# Patient Record
Sex: Male | Born: 1995
Health system: Southern US, Community
[De-identification: ages and names within clinical notes are randomized; demographics above are authoritative.]

---
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
## 2016-03-27 ENCOUNTER — Encounter (HOSPITAL_COMMUNITY): Payer: Self-pay | Admitting: Emergency Medicine

## 2016-03-27 ENCOUNTER — Ambulatory Visit (HOSPITAL_COMMUNITY)
Admission: EM | Admit: 2016-03-27 | Discharge: 2016-03-27 | Disposition: A | Discharge status: Home / Self Care | Payer: 59 | Attending: Family Medicine | Admitting: Family Medicine

## 2016-03-27 DIAGNOSIS — G43A0 Cyclical vomiting, not intractable: Secondary | ICD-10-CM | POA: Diagnosis not present

## 2016-03-27 DIAGNOSIS — R6889 Other general symptoms and signs: Secondary | ICD-10-CM | POA: Diagnosis not present

## 2016-03-27 DIAGNOSIS — R5081 Fever presenting with conditions classified elsewhere: Secondary | ICD-10-CM

## 2016-03-27 MED ORDER — ONDANSETRON 4 MG PO TBDP
4.0000 mg | ORAL_TABLET | Freq: Once | ORAL | Status: AC
  Administered 2016-03-27: 4 mg via ORAL

## 2016-03-27 MED ORDER — ACETAMINOPHEN 325 MG PO TABS
650.0000 mg | ORAL_TABLET | Freq: Once | ORAL | Status: AC
  Administered 2016-03-27: 650 mg via ORAL

## 2016-03-27 MED ORDER — ONDANSETRON 4 MG PO TBDP
ORAL_TABLET | ORAL | Status: AC
  Filled 2016-03-27: qty 1

## 2016-03-27 MED ORDER — ONDANSETRON 8 MG PO TBDP: 8.0000 mg | ORAL_TABLET | Freq: Three times a day (TID) | ORAL | 0 refills | Status: DC | PRN

## 2016-03-27 MED ORDER — ACETAMINOPHEN 325 MG PO TABS
ORAL_TABLET | ORAL | Status: AC
  Filled 2016-03-27: qty 2

## 2016-03-27 MED ORDER — OSELTAMIVIR PHOSPHATE 75 MG PO CAPS: 75.0000 mg | ORAL_CAPSULE | Freq: Two times a day (BID) | ORAL | 0 refills | Status: DC

## 2016-03-27 NOTE — ED Triage Notes (Signed)
[facility] with a complaint of abdominal cramping with N/V and general body aches that started today.

## 2016-03-27 NOTE — ED Provider Notes (Signed)
CSN: [id number]     Arrival date & time 03/27/16  1836 History   First MD Initiated Contact with Patient 03/27/16 1950     Chief Complaint  Patient presents with  . Abdominal Cramping   (Consider location/radiation/quality/duration/timing/severity/associated sxs/prior Treatment) Patient c/o NVD fever and vomiting.   The history is provided by the patient.  Abdominal Cramping  This is a new problem. The current episode started more than 2 days ago. The problem occurs constantly. The problem has not changed since onset.Nothing aggravates the symptoms. Nothing relieves the symptoms. He has tried nothing for the symptoms.    History reviewed. No pertinent past medical history. History reviewed. No pertinent surgical history. History reviewed. No pertinent family history. Social History  Substance Use Topics  . Smoking status: Current Some Day Smoker    Packs/day: 0.50    Types: Cigarettes  . Smokeless tobacco: Never Used  . Alcohol use No    Review of Systems  Constitutional: Negative.   HENT: Negative.   Eyes: Negative.   Respiratory: Negative.   Cardiovascular: Negative.   Gastrointestinal: Positive for diarrhea, nausea and vomiting.  Endocrine: Negative.   Genitourinary: Negative.   Musculoskeletal: Negative.   Skin: Negative.   Allergic/Immunologic: Negative.   Neurological: Negative.   Hematological: Negative.   Psychiatric/Behavioral: Negative.     Allergies  Patient has no known allergies.  Home Medications   Prior to Admission medications   Medication Sig Start Date End Date Taking? Authorizing Provider  ondansetron (ZOFRAN ODT) 8 MG disintegrating tablet Take 1 tablet (8 mg total) by mouth every 8 (eight) hours as needed for nausea or vomiting. 03/27/16   Deatra Canter, FNP  oseltamivir (TAMIFLU) 75 MG capsule Take 1 capsule (75 mg total) by mouth every 12 (twelve) hours. 03/27/16   Deatra Canter, FNP   Meds Ordered and Administered this Visit    Medications  ondansetron (ZOFRAN-ODT) disintegrating tablet 4 mg (not administered)  acetaminophen (TYLENOL) tablet 650 mg (650 mg Oral Given 03/27/16 1923)    BP 110/84 (BP Location: Left Arm)   Pulse 108   Temp 102.3 F (39.1 C) (Oral)   Resp 16   SpO2 100%  No data found.   Physical Exam  Constitutional: He appears well-developed and well-nourished.  HENT:  Head: Normocephalic and atraumatic.  Right Ear: External ear normal.  Left Ear: External ear normal.  Mouth/Throat: Oropharynx is clear and moist.  Eyes: EOM are normal. Pupils are equal, round, and reactive to light.  Neck: Normal range of motion. Neck supple.  Cardiovascular: Normal rate, regular rhythm and normal heart sounds.   Pulmonary/Chest: Effort normal and breath sounds normal.  Abdominal: Soft. Bowel sounds are normal.  Musculoskeletal: Normal range of motion.  Nursing note and vitals reviewed.   Urgent Care Course   Clinical Course     Procedures (including critical care time)  Labs Review Labs Reviewed - No data to display  Imaging Review No results found.   Visual Acuity Review  Right Eye Distance:   Left Eye Distance:   Bilateral Distance:    Right Eye Near:   Left Eye Near:    Bilateral Near:         MDM   1. Cyclical vomiting with nausea, intractability of vomiting not specified   2. Flu-like symptoms   3. Fever in other diseases    Zofran ODT 4mg  po qd  Zofran ODT 8mg  po tid prn #20 Tamiflu 75mg  one po bid x 5 days #  10  Push po fluids, rest, tylenol and motrin otc prn as directed for fever, arthralgias, and myalgias.  Follow up prn if sx's continue or persist.    Deatra CanterWilliam J Alfred Eckley, FNP 03/27/16 2005

## 2016-03-28 MED FILL — OSELTAMIVIR PHOS 75 MG CAP: 75 | 5 days supply | Qty: 10 | Fill #0

## 2016-03-28 MED FILL — ONDANSETRON ODT 8 MG TABLET: 8 | 7 days supply | Qty: 20 | Fill #0

## 2016-11-20 ENCOUNTER — Ambulatory Visit: Payer: 59 | Admitting: Family Medicine

## 2018-05-05 DIAGNOSIS — M549 Dorsalgia, unspecified: Secondary | ICD-10-CM | POA: Diagnosis not present

## 2018-05-05 DIAGNOSIS — Z7689 Persons encountering health services in other specified circumstances: Secondary | ICD-10-CM | POA: Diagnosis not present

## 2018-05-05 DIAGNOSIS — B360 Pityriasis versicolor: Secondary | ICD-10-CM | POA: Diagnosis not present

## 2018-05-05 DIAGNOSIS — Z2821 Immunization not carried out because of patient refusal: Secondary | ICD-10-CM | POA: Diagnosis not present

## 2018-05-21 MED FILL — ITRACONAZOLE 100 MG CAPSULE: 100 | 5 days supply | Qty: 10 | Fill #0

## 2018-05-21 MED FILL — KETOCONAZOLE 2% CREAM: 2 | 10 days supply | Qty: 60 | Fill #0

## 2020-10-20 ENCOUNTER — Other Ambulatory Visit (HOSPITAL_COMMUNITY): Payer: Self-pay

## 2020-10-20 MED ORDER — CARESTART COVID-19 HOME TEST VI KIT
PACK | 0 refills | Status: DC
  Filled 2020-10-20: qty 4, 4d supply, fill #0

## 2020-10-23 ENCOUNTER — Other Ambulatory Visit (HOSPITAL_COMMUNITY): Payer: Self-pay

## 2020-12-06 DIAGNOSIS — M25512 Pain in left shoulder: Secondary | ICD-10-CM | POA: Diagnosis not present

## 2020-12-06 DIAGNOSIS — S43402A Unspecified sprain of left shoulder joint, initial encounter: Secondary | ICD-10-CM | POA: Diagnosis not present

## 2020-12-06 DIAGNOSIS — F17210 Nicotine dependence, cigarettes, uncomplicated: Secondary | ICD-10-CM | POA: Diagnosis not present

## 2020-12-06 DIAGNOSIS — S46812A Strain of other muscles, fascia and tendons at shoulder and upper arm level, left arm, initial encounter: Secondary | ICD-10-CM | POA: Diagnosis not present

## 2021-07-11 ENCOUNTER — Encounter (HOSPITAL_COMMUNITY): Payer: Self-pay | Admitting: Emergency Medicine

## 2021-07-11 ENCOUNTER — Ambulatory Visit (HOSPITAL_COMMUNITY)
Admission: EM | Admit: 2021-07-11 | Discharge: 2021-07-11 | Disposition: A | Discharge status: Home / Self Care | Payer: 59 | Attending: Emergency Medicine | Admitting: Emergency Medicine

## 2021-07-11 DIAGNOSIS — J029 Acute pharyngitis, unspecified: Secondary | ICD-10-CM | POA: Diagnosis not present

## 2021-07-11 DIAGNOSIS — R519 Headache, unspecified: Secondary | ICD-10-CM | POA: Insufficient documentation

## 2021-07-11 DIAGNOSIS — R0789 Other chest pain: Secondary | ICD-10-CM | POA: Diagnosis not present

## 2021-07-11 DIAGNOSIS — Z20822 Contact with and (suspected) exposure to covid-19: Secondary | ICD-10-CM | POA: Diagnosis not present

## 2021-07-11 DIAGNOSIS — R030 Elevated blood-pressure reading, without diagnosis of hypertension: Secondary | ICD-10-CM | POA: Insufficient documentation

## 2021-07-11 LAB — POC INFLUENZA A AND B ANTIGEN (URGENT CARE ONLY)
INFLUENZA A ANTIGEN, POC: NEGATIVE
INFLUENZA B ANTIGEN, POC: NEGATIVE

## 2021-07-11 LAB — POCT RAPID STREP A, ED / UC: Streptococcus, Group A Screen (Direct): NEGATIVE

## 2021-07-11 MED ORDER — IBUPROFEN 800 MG PO TABS: 800.0000 mg | ORAL_TABLET | Freq: Three times a day (TID) | ORAL | 0 refills | Status: DC

## 2021-07-11 NOTE — Discharge Instructions (Addendum)
You will be called with the result of the covid rapid in 24 hours- trial of motrin for headache pain and chest wall pain- read the included information. Rapid strep was neagative  and flu was negative . 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 Start regularly checking your blood pressures at home. If elevated more than 50 % of the time, it may be time to start BP lowering medications ?

## 2021-07-11 NOTE — ED Triage Notes (Signed)
Pt repots headache that is intermittent in bilat temporal areas and center chest pains as well. 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  ?

## 2021-07-11 NOTE — ED Provider Notes (Signed)
?[place] ? ? ?MRN: [id number] DOB: 1996/02/11 ? ?Subjective:  ? ?Chief Complaint;  ?Chief Complaint  ?Patient presents with  ? Headache  ? Chest Pain  ? ?Pt reports headache that is intermittent in bilat temporal areas and posterior 3/10- constant waxes and wanes highest "5" and center chest pains as well. 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 ?KASHTON MCARTOR is a 26 y.o. male presenting for 2-day history of headaches bilateral temporal and posterior head.  Patient states he was at the [facility] park a couple of days ago and shortly thereafter started not feeling well positive mild nausea.  He denies fever, visual changes, shortness of breath, vomiting chest pain or cough-  ? ?No current facility-administered medications for this encounter. ? ?Current Outpatient Medications:  ?  ibuprofen (ADVIL) 800 MG tablet, Take 1 tablet (800 mg total) by mouth 3 (three) times daily., Disp: 21 tablet, Rfl: 0 ?  COVID-19 At Home Antigen Test [facility] COVID-19 HOME TEST) KIT, Use as directed, Disp: 4 each, Rfl: 0 ?  ondansetron (ZOFRAN ODT) 8 MG disintegrating tablet, Take 1 tablet (8 mg total) by mouth every 8 (eight) hours as needed for nausea or vomiting., Disp: 20 tablet, Rfl: 0 ?  oseltamivir (TAMIFLU) 75 MG capsule, Take 1 capsule (75 mg total) by mouth every 12 (twelve) hours., Disp: 10 capsule, Rfl: 0  ? ?No Known Allergies ? ?History reviewed. No pertinent past medical history.  ? ?Review of Systems  ?All other systems reviewed and are negative. ? ? ?Objective:  ? ?Vitals: ?BP (!) 155/96 (BP Location: Right Arm)   Pulse 73   Temp 98.3 ?F (36.8 ?C) (Oral)   Resp 14   SpO2 98%  ? ?Physical Exam ?Vitals and nursing note reviewed.  ?Constitutional:   ?   General: He is not in acute distress. ?   Appearance: He is well-developed.  ?HENT:  ?   Head: Normocephalic and atraumatic.  ?    Comments: Positive posterior pharyngeal erythema without significant injection tonsils absent no exudate no stridor ?Eyes:  ?   Conjunctiva/sclera: Conjunctivae normal.  ?Cardiovascular:  ?   Rate and Rhythm: Normal rate and regular rhythm.  ?   Heart sounds: Normal heart sounds. No murmur heard. ?   Comments: Chest pain is reproduced on compression of upper sternum- no rash ?Pulmonary:  ?   Effort: Pulmonary effort is normal. No respiratory distress.  ?   Breath sounds: Normal breath sounds.  ?Abdominal:  ?   Palpations: Abdomen is soft.  ?   Tenderness: There is no abdominal tenderness.  ?Musculoskeletal:     ?   General: No swelling.  ?   Cervical back: Normal range of motion and neck supple.  ?Skin: ?   General: Skin is warm and dry.  ?   Capillary Refill: Capillary refill takes less than 2 seconds.  ?Neurological:  ?   Mental Status: He is alert.  ?   Cranial Nerves: No cranial nerve deficit.  ?Psychiatric:     ?   Mood and Affect: Mood normal.  ? ? ?Results for orders placed or performed during the hospital encounter of 07/11/21 (from the past 24 hour(s))  ?POCT Rapid Strep A     Status: None  ? Collection Time: 07/11/21  2:42 PM  ?Result Value Ref Range  ? Streptococcus, Group A Screen (  Direct) NEGATIVE NEGATIVE  ?POC Influenza A & B Ag (Urgent Care)     Status: None  ? Collection Time: 07/11/21  2:46 PM  ?Result Value Ref Range  ? INFLUENZA A ANTIGEN, POC NEGATIVE NEGATIVE  ? INFLUENZA B ANTIGEN, POC NEGATIVE NEGATIVE  ? ? ?No results found.  ?  ? ?Assessment and Plan :  ? ?1. Acute intractable headache, unspecified headache type   ?2. Elevated blood pressure reading   ?3. Chest wall pain   ? ? ?Meds ordered this encounter  ?Medications  ? ibuprofen (ADVIL) 800 MG tablet  ?  Sig: Take 1 tablet (800 mg total) by mouth 3 (three) times daily.  ?  Dispense:  21 tablet  ?  Refill:  0  ? ? ?MDM:  ?MARLO GOODRICH is a 26 y.o. male presenting for 2-day history of bitemporal and posterior headaches that wax and  wane throughout the day but did not resolve.  He has not been taking any medications for this and denies any fever or neurologic deficits.  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  COVID is pending.  Flu and strep were negative ?Patient is encouraged to use Motrin for chest wall pain and headaches that is reproducible and will start checking blood pressures at home given his elevated blood pressure today.  As discussed with patient he may need to consider none freezer room work to prevent persistent headaches.  I discussed treatment, follow up and return instructions. Questions were answered. Patient stated understanding of instructions and is stable for discharge. ? ?Leida Lauth FNP-C MCN  ? ?  ?Hezzie Bump, NP ?07/11/21 1448 ? ?

## 2021-07-12 LAB — SARS CORONAVIRUS 2 (TAT 6-24 HRS): SARS Coronavirus 2: NEGATIVE

## 2021-07-17 NOTE — Progress Notes (Deleted)
   New Patient Office Visit  Subjective    Patient ID: MILBERT BIXLER, male    DOB: 04-30-1995  Age: 26 y.o. MRN: [id number]  CC: No chief complaint on file.   HPI Christopher Simmons presents for new patient visit to establish care.  Introduced to Designer, jewellery role and practice setting.  All questions answered.  Discussed provider/patient relationship and expectations.   Outpatient Encounter Medications as of 07/19/2021  Medication Sig   COVID-19 At Home Antigen Test ([facility] COVID-19 HOME TEST) KIT Use as directed   ibuprofen (ADVIL) 800 MG tablet Take 1 tablet (800 mg total) by mouth 3 (three) times daily.   ondansetron (ZOFRAN ODT) 8 MG disintegrating tablet Take 1 tablet (8 mg total) by mouth every 8 (eight) hours as needed for nausea or vomiting.   oseltamivir (TAMIFLU) 75 MG capsule Take 1 capsule (75 mg total) by mouth every 12 (twelve) hours.   No facility-administered encounter medications on file as of 07/19/2021.    No past medical history on file.  No past surgical history on file.  No family history on file.  Social History   Socioeconomic History   Marital status: Single    Spouse name: Not on file   Number of children: Not on file   Years of education: Not on file   Highest education level: Not on file  Occupational History   Not on file  Tobacco Use   Smoking status: Some Days    Packs/day: 0.50    Types: Cigarettes   Smokeless tobacco: Never  Substance and Sexual Activity   Alcohol use: No   Drug use: Yes    Types: Marijuana   Sexual activity: Never  Other Topics Concern   Not on file  Social History Narrative   Not on file   Social Determinants of Health   Financial Resource Strain: Not on file  Food Insecurity: Not on file  Transportation Needs: Not on file  Physical Activity: Not on file  Stress: Not on file  Social Connections: Not on file  Intimate Partner Violence: Not on file    ROS      Objective    There were no  vitals taken for this visit.  Physical Exam  {Labs (Optional):23779}    Assessment & Plan:   Problem List Items Addressed This Visit   None   No follow-ups on file.   Charyl Dancer, NP

## 2021-07-19 ENCOUNTER — Ambulatory Visit: Payer: 59 | Admitting: Nurse Practitioner

## 2021-07-19 ENCOUNTER — Telehealth: Payer: Self-pay | Admitting: Nurse Practitioner

## 2021-07-19 NOTE — Telephone Encounter (Signed)
Pt was a no show for a NP app with Lauren on 07/19/21, I sent a no show letter. ?

## 2021-07-26 NOTE — Telephone Encounter (Signed)
1st no show, fee waived ?

## 2023-03-25 ENCOUNTER — Ambulatory Visit (INDEPENDENT_AMBULATORY_CARE_PROVIDER_SITE_OTHER): Payer: Self-pay | Admitting: Gastroenterology

## 2023-03-25 ENCOUNTER — Other Ambulatory Visit (INDEPENDENT_AMBULATORY_CARE_PROVIDER_SITE_OTHER): Payer: Self-pay

## 2023-03-25 ENCOUNTER — Encounter: Payer: Self-pay | Admitting: Gastroenterology

## 2023-03-25 VITALS — BP 100/60 | Temp 78.0°F | Ht 75.0 in | Wt 168.0 lb

## 2023-03-25 DIAGNOSIS — K6289 Other specified diseases of anus and rectum: Secondary | ICD-10-CM

## 2023-03-25 DIAGNOSIS — K625 Hemorrhage of anus and rectum: Secondary | ICD-10-CM

## 2023-03-25 DIAGNOSIS — R195 Other fecal abnormalities: Secondary | ICD-10-CM

## 2023-03-25 DIAGNOSIS — K648 Other hemorrhoids: Secondary | ICD-10-CM

## 2023-03-25 LAB — CBC
HCT: 43.5 % (ref 39.0–52.0)
Hemoglobin: 14.7 g/dL (ref 13.0–17.0)
MCHC: 33.8 g/dL (ref 30.0–36.0)
MCV: 92.1 fL (ref 78.0–100.0)
Platelets: 193 10*3/uL (ref 150.0–400.0)
RBC: 4.72 Mil/uL (ref 4.22–5.81)
RDW: 13.6 % (ref 11.5–15.5)
WBC: 5.2 10*3/uL (ref 4.0–10.5)

## 2023-03-25 MED ORDER — HYDROCORTISONE ACETATE 25 MG RE SUPP: 25.0000 mg | Freq: Two times a day (BID) | RECTAL | 0 refills | Status: AC

## 2023-03-25 NOTE — Patient Instructions (Addendum)
 We have sent the following medications to your pharmacy for you to pick up at your convenience: Anusol  Suppositories- Use 1 suppository at  bedtime for 1 week, then use every other night until completion of prescription.    Send my chart message in about 1-2 weeks with updated symptoms.   Follow-up on : 05/27/23 at 3:10 pm    If your blood pressure at your visit was 140/90 or greater, please contact your primary care physician to follow up on this.  _______________________________________________________  If you are age 6 or older, your body mass index should be between 23-30. Your Body mass index is 21 kg/m. If this is out of the aforementioned range listed, please consider follow up with your Primary Care Provider.  If you are age 8 or younger, your body mass index should be between 19-25. Your Body mass index is 21 kg/m. If this is out of the aformentioned range listed, please consider follow up with your Primary Care Provider.   ________________________________________________________  The [facility] providers would like to encourage you to use MYCHART to communicate with providers for non-urgent requests or questions.  Due to long hold times on the telephone, sending your provider a message by [facility] may be a faster and more efficient way to get a response.  Please allow 48 business hours for a response.  Please remember that this is for non-urgent requests.  _______________________________________________________  Thank you for choosing me and [facility].  Dr. Wilhelmenia

## 2023-03-26 ENCOUNTER — Encounter: Payer: Self-pay | Admitting: Gastroenterology

## 2023-03-26 DIAGNOSIS — K648 Other hemorrhoids: Secondary | ICD-10-CM | POA: Insufficient documentation

## 2023-03-26 DIAGNOSIS — R195 Other fecal abnormalities: Secondary | ICD-10-CM | POA: Insufficient documentation

## 2023-03-26 DIAGNOSIS — K6289 Other specified diseases of anus and rectum: Secondary | ICD-10-CM | POA: Insufficient documentation

## 2023-03-26 DIAGNOSIS — K625 Hemorrhage of anus and rectum: Secondary | ICD-10-CM | POA: Insufficient documentation

## 2023-03-26 NOTE — Progress Notes (Signed)
 GASTROENTEROLOGY OUTPATIENT CLINIC VISIT   Primary Care Provider Patient, No Pcp Per No address on file None  Referring Provider No referring provider defined for this encounter.   Patient Profile: Christopher Simmons is a 28 y.o. male without a significant PMH.  The patient presents to the [facility] for an evaluation and management of problem(s) noted below:  Problem List 1. Rectal pain   2. Rectal bleeding   3. Other hemorrhoids   4. Change in stool caliber    Discussed the use of AI scribe software for clinical note transcription with the patient, who gave verbal consent to proceed.  History of Present Illness This is the patient's first visit to the [facility] clinic.  The patient presents with a several week history of rectal bleeding and rectal discomfort.  He describes a red cloudy appearance coming off his stool over the course of these last few weeks.  He has not noted any melanic stools or clots.  He has a normal, brown, formed bowel movement daily.   There has been a slight change in the caliber of stool, but definitely not becoming pencil thin.  During defecation, the patient experiences a sharp and stinging sensation in the anorectum that persists for about five minutes post-defecation before subsiding.  The patient also reports transient lower abdominal discomfort, akin to cramping, immediately after passing stool.  He denies any familial history of colon cancer or ulcerative colitis.  There is no history of anal-receptive intercourse.  The patient also reports nocturnal anal itching, which started around the same time as his other symptoms.  He was prescribed a hemorrhoidal suppository but never picked it up due to prior authorization issues.  He does not report any issues with taking this medication otherwise if needed.  The patient has never had an upper or lower endoscopy.    GI Review of Systems Positive as above Negative for dysphagia,  odynophagia, nausea, vomiting  Review of Systems General: Denies fevers/chills/weight loss unintentionally Cardiovascular: Denies chest pain Pulmonary: Denies shortness of breath Gastroenterological: See HPI Genitourinary: Denies darkened urine  Hematological: Denies easy bruising/bleeding Endocrine: Denies temperature intolerance Dermatological: Denies jaundice Psychological: Mood is stable   Medications Current Outpatient Medications  Medication Sig Dispense Refill   hydrocortisone  (ANUSOL -HC) 25 MG suppository Place 1 suppository (25 mg total) rectally every 12 (twelve) hours. 12 suppository 0   No current facility-administered medications for this visit.    Allergies No Known Allergies  Histories History reviewed. No pertinent past medical history. History reviewed. No pertinent surgical history. Social History   Socioeconomic History   Marital status: Single    Spouse name: Not on file   Number of children: Not on file   Years of education: Not on file   Highest education level: Not on file  Occupational History   Not on file  Tobacco Use   Smoking status: Some Days    Current packs/day: 0.50    Types: Cigarettes   Smokeless tobacco: Never  Substance and Sexual Activity   Alcohol use: No   Drug use: Yes    Types: Marijuana   Sexual activity: Never  Other Topics Concern   Not on file  Social History Narrative   Not on file   Social Drivers of Health   Financial Resource Strain: Not on file  Food Insecurity: Not on file  Transportation Needs: Not on file  Physical Activity: Not on file  Stress: Not on file  Social Connections: Unknown (07/31/2021)  [facility] from [facility]   Social Network    Social Network: Not on file  Intimate Partner Violence: Unknown (06/21/2021)   Received from [facility]   HITS    Physically Hurt: Not on file    Insult or Talk Down To: Not on file    Threaten Physical Harm: Not on file    Scream or Curse: Not on file    Family History  Problem Relation Age of Onset   Heart disease Paternal Uncle    Colon cancer Neg Hx    Esophageal cancer Neg Hx    Inflammatory bowel disease Neg Hx    Liver disease Neg Hx    Pancreatic cancer Neg Hx    Rectal cancer Neg Hx    Stomach cancer Neg Hx    I have reviewed his medical, social, and family history in detail and updated the electronic medical record as necessary.    PHYSICAL EXAMINATION  BP 100/60   Temp (!) 78 F (25.6 C)   Ht 6' 3 (1.905 m)   Wt 168 lb (76.2 kg)   BMI 21.00 kg/m  Wt Readings from Last 3 Encounters:  03/25/23 168 lb (76.2 kg)  GEN: NAD, appears stated age, doesn't appear chronically ill PSYCH: Cooperative, without pressured speech EYE: Conjunctivae pink, sclerae anicteric ENT: MMM CV: Nontachycardic RESP: No audible wheezing GI: NABS, soft, NT/ND, without rebound or guarding GU: Perianal exam shows no overt hemorrhoids significant anal fissure not noted, digital rectal exam evidence of hemorrhoids, no palpable rectal mass MSK/EXT: No significant lower extremity edema SKIN: No jaundice NEURO:  Alert & Oriented x 3, no focal deficits   REVIEW OF DATA  I reviewed the following data at the time of this encounter:  GI Procedures and Studies  No relevant studies to review  Laboratory Studies  Reviewed those in epic  Imaging Studies  No relevant studies to review   ASSESSMENT  Mr. Helming is a 28 y.o. male without a significant PMH.  The patient is seen today for evaluation and management of:  1. Rectal pain   2. Rectal bleeding   3. Other hemorrhoids   4. Change in stool caliber    The patient is hemodynamically stable at this time.  Clinically, history is suggestive of rectal bleeding most likely a result of hemorrhoids or potential healing or in process of healing anal fissure (though exam did not overtly show 1 today).  Will plan to have patient initiate Anusol  suppositories and use GoodRx in effort of trying to  get this covered more significantly than what was occurring with insurance.  If this still remains out of reach, we will consider Calmol-4 suppositories or Preparation H suppositories.  He will use these for the next 2 and half weeks in effort of seeing if this improves his symptoms.  If it does not, we will go ahead and treat him for potential anal fissure and see if that makes a difference for him.  Endoscopic evaluation from below is recommended but would like to try to help the discomfort first before putting him through colonoscopy/sigmoidoscopy.  Patient is in agreement with this plan of action.  He will update us  in the next few weeks and we will see him in follow-up.  Will check a blood count today just to ensure no evidence of anemia has developed.  All patient questions were answered to the best of my ability, and the patient agrees to the aforementioned plan of action with follow-up as indicated.  PLAN  CBC today Initiate Anusol  nightly for 1 week then every other night Toileting instructions discussed with patient Initiate fiber supplement daily Initiate stool softener daily If this is ineffective, we will treat patient for anal fissure with nitroglycerin or diltiazem ointment Patient will MyChart us  to let us  know how things are doing in the next few weeks Follow-up in 6 to 8 weeks Plan for endoscopic evaluation from below but will work on discomfort first before putting him through preparation for possible   Orders Placed This Encounter  Procedures   CBC    New Prescriptions   HYDROCORTISONE  (ANUSOL -HC) 25 MG SUPPOSITORY    Place 1 suppository (25 mg total) rectally every 12 (twelve) hours.   Modified Medications   No medications on file    Planned Follow Up No follow-ups on file.   Total Time in Face-to-Face and in Coordination of Care for patient including independent/personal interpretation/review of prior testing, medical history, examination, medication adjustment,  communicating results with the patient directly, and documentation within the EHR is 45 minutes.   Aloha Finner, MD [facility] Office # [phone]

## 2023-05-27 ENCOUNTER — Ambulatory Visit: Payer: Self-pay | Admitting: Gastroenterology

## 2023-11-28 ENCOUNTER — Encounter (HOSPITAL_COMMUNITY): Payer: Self-pay

## 2023-11-28 ENCOUNTER — Emergency Department (HOSPITAL_COMMUNITY)
Admission: EM | Admit: 2023-11-28 | Discharge: 2023-11-29 | Disposition: A | Discharge status: Home / Self Care | Payer: Self-pay | Attending: Emergency Medicine | Admitting: Emergency Medicine

## 2023-11-28 ENCOUNTER — Other Ambulatory Visit: Payer: Self-pay

## 2023-11-28 DIAGNOSIS — S025XXA Fracture of tooth (traumatic), initial encounter for closed fracture: Secondary | ICD-10-CM | POA: Insufficient documentation

## 2023-11-28 DIAGNOSIS — X58XXXA Exposure to other specified factors, initial encounter: Secondary | ICD-10-CM | POA: Insufficient documentation

## 2023-11-28 DIAGNOSIS — K029 Dental caries, unspecified: Secondary | ICD-10-CM | POA: Insufficient documentation

## 2023-11-28 NOTE — ED Triage Notes (Signed)
 Pt reports dental pain, onset tonight. Unsure which exact tooth but reports pain to left upper side of his face. He reports cracked teeth. Pain unrelieved with Tylenol .

## 2023-11-29 MED ORDER — PENICILLIN V POTASSIUM 250 MG PO TABS
500.0000 mg | ORAL_TABLET | Freq: Once | ORAL | Status: AC
  Administered 2023-11-29: 500 mg via ORAL
  Filled 2023-11-29: qty 2

## 2023-11-29 MED ORDER — PENICILLIN V POTASSIUM 500 MG PO TABS: 500.0000 mg | ORAL_TABLET | Freq: Four times a day (QID) | ORAL | 0 refills | Status: AC

## 2023-11-29 MED ORDER — NAPROXEN 250 MG PO TABS
500.0000 mg | ORAL_TABLET | Freq: Once | ORAL | Status: AC
  Administered 2023-11-29: 500 mg via ORAL
  Filled 2023-11-29: qty 2

## 2023-11-29 NOTE — ED Notes (Signed)
 ..  The patient is A&OX4, ambulatory at d/c with independent steady gait, NAD. Pt verbalized understanding of d/c instructions, prescription and follow up care.

## 2023-11-29 NOTE — Discharge Instructions (Addendum)
 Take 600mg  Advil  Liquid Gel with 650mg  of Tylenol  every 6 hours for pain. Take antibiotics as prescribed.  Rinse with listerine after every meal. Follow up with a dentist as soon as possible.

## 2023-11-29 NOTE — ED Provider Notes (Signed)
 [place] EMERGENCY DEPARTMENT AT [facility] Provider Note   CSN: [id number] Arrival date & time: 11/28/23  2210     Patient presents with: Dental Pain   Christopher Simmons is a 28 y.o. male.   28 year old male presents with left lower dental pain.  Reports biting into something recently and feeling his tooth crack.  Has been able to tolerate discomfort for the past month but now with worsening pain.  Otherwise, no trauma, no fever, no drainage.       Prior to Admission medications   Medication Sig Start Date End Date Taking? Authorizing Provider  penicillin  v potassium (VEETID) 500 MG tablet Take 1 tablet (500 mg total) by mouth 4 (four) times daily for 10 days. 11/29/23 12/09/23 Yes Beverley Leita LABOR, PA-C  hydrocortisone  (ANUSOL -HC) 25 MG suppository Place 1 suppository (25 mg total) rectally every 12 (twelve) hours. 03/25/23   Mansouraty, Aloha Raddle., MD    Allergies: Patient has no known allergies.    Review of Systems Negative except as per HPI Updated Vital Signs BP (!) 146/96 (BP Location: Right Arm)   Pulse 60   Temp 98 F (36.7 C)   Resp 18   Ht 6' 3 (1.905 m)   Wt 74.8 kg   SpO2 100%   BMI 20.62 kg/m   Physical Exam Vitals and nursing note reviewed.  Constitutional:      General: He is not in acute distress.    Appearance: He is well-developed. He is not diaphoretic.  HENT:     Head: Normocephalic and atraumatic.     Jaw: No trismus.     Nose: Nose normal.     Mouth/Throat:     Mouth: Mucous membranes are moist.   Pulmonary:     Effort: Pulmonary effort is normal.  Musculoskeletal:     Cervical back: Neck supple.  Lymphadenopathy:     Cervical: No cervical adenopathy.  Skin:    General: Skin is warm and dry.  Neurological:     Mental Status: He is alert and oriented to person, place, and time.  Psychiatric:        Behavior: Behavior normal.     (all labs ordered are listed, but only abnormal results are displayed) Labs Reviewed -  No data to display  EKG: None  Radiology: No results found.   Procedures   Medications Ordered in the ED  penicillin  v potassium (VEETID) tablet 500 mg (has no administration in time range)  naproxen  (NAPROSYN ) tablet 500 mg (has no administration in time range)                                    Medical Decision Making Risk Prescription drug management.   28 year old male with left lower molar pain after breaking tooth biting into something.  No trismus, no obvious abscess, tongue is midline.  Plan is to start on antibiotics, refer to dentist.  Recommend ibuprofen  and Tylenol  for pain with Listerine rinse.     Final diagnoses:  Dental caries  Closed fracture of tooth, initial encounter    ED Discharge Orders          Ordered    penicillin  v potassium (VEETID) 500 MG tablet  4 times daily        11/29/23 0201               Beverley Leita LABOR, PA-C 11/29/23 0205  Franklyn Sid SAILOR, MD 11/29/23 [phone]
